# Patient Record
Sex: Male | Born: 1995 | Race: Black or African American | Hispanic: No | Marital: Single | Smoking: Never smoker
Health system: Southern US, Community
[De-identification: ages and names within clinical notes are randomized; demographics above are authoritative.]

---
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
## 2020-12-20 ENCOUNTER — Emergency Department (HOSPITAL_BASED_OUTPATIENT_CLINIC_OR_DEPARTMENT_OTHER)
Admission: EM | Admit: 2020-12-20 | Discharge: 2020-12-20 | Disposition: A | Discharge status: Home / Self Care | Payer: Self-pay | Attending: Emergency Medicine | Admitting: Emergency Medicine

## 2020-12-20 ENCOUNTER — Other Ambulatory Visit: Payer: Self-pay

## 2020-12-20 ENCOUNTER — Encounter (HOSPITAL_BASED_OUTPATIENT_CLINIC_OR_DEPARTMENT_OTHER): Payer: Self-pay

## 2020-12-20 DIAGNOSIS — R062 Wheezing: Secondary | ICD-10-CM | POA: Insufficient documentation

## 2020-12-20 DIAGNOSIS — R0602 Shortness of breath: Secondary | ICD-10-CM | POA: Insufficient documentation

## 2020-12-20 DIAGNOSIS — R0789 Other chest pain: Secondary | ICD-10-CM | POA: Insufficient documentation

## 2020-12-20 DIAGNOSIS — Z5321 Procedure and treatment not carried out due to patient leaving prior to being seen by health care provider: Secondary | ICD-10-CM | POA: Insufficient documentation

## 2020-12-20 NOTE — ED Notes (Signed)
Pt left from waiting room, informed registration that he was leaving. Pt was alert, no s/s of distress.Pt signed MSE.

## 2020-12-20 NOTE — ED Triage Notes (Signed)
CP and some wheezing/SOB after eating a new dietary supplement.  SOB has subsided on arrival but he is currently c/o chest pressure.  Denies allergies to foods/dyes/latex.

## 2020-12-20 NOTE — ED Triage Notes (Signed)
Emergency Medicine Provider Triage Evaluation Note  Cody Rodgers , a 25 y.o. male  was evaluated in triage.  Pt complains of wheezing, chest pain at 12:40 after taking superfood - "Ideal greens" . Second time taking it. Took the powder in a spoon and put it in his mouth and then started coughing and had some wheezing, mild CP, mild SOB Mostly resolved, mild wheezing, no CP anymore.  No hx of asthma.    Review of Systems  Positive: Wheezing, cp after eating Ideal greens around lunch.  Negative: Lip swelling, rash, pruritis   Physical Exam  BP (!) 147/87 (BP Location: Left Arm)   Pulse 73   Temp 98.7 F (37.1 C) (Oral)   Resp 18   Ht 6\' 5"  (1.956 m)   Wt 128.8 kg   SpO2 100%   BMI 33.68 kg/m  Gen:   Awake, no distress   HEENT:  Atraumatic, no angioedema  Resp:  Normal effort. Mild expiratory wheezes heardon exam.  Cardiac:  Normal rate Abd:   Nondistended, nontender  MSK:   Moves extremities without difficulty  Skin:  No Rash  Neuro:  Speech clear, moving all extremities, normal gait   Medical Decision Making  Medically screening exam initiated at 3:58 PM.  Appropriate orders placed.  Cody Rodgers was informed that the remainder of the evaluation will be completed by another provider, this initial triage assessment does not replace that evaluation, and the importance of remaining in the ED until their evaluation is complete.  Clinical Impression  Cough/wheezing after superfoods. Possible aspiration vs allergic reaction. Stable, no respiratory distress. Does not appear cardiac.    Herbert Spires, PA-C 12/20/20 [phone]

## 2021-01-31 ENCOUNTER — Encounter (HOSPITAL_BASED_OUTPATIENT_CLINIC_OR_DEPARTMENT_OTHER): Payer: Self-pay | Admitting: *Deleted

## 2021-01-31 ENCOUNTER — Emergency Department (HOSPITAL_BASED_OUTPATIENT_CLINIC_OR_DEPARTMENT_OTHER)
Admission: EM | Admit: 2021-01-31 | Discharge: 2021-01-31 | Disposition: A | Discharge status: Home / Self Care | Payer: 59 | Attending: Emergency Medicine | Admitting: Emergency Medicine

## 2021-01-31 ENCOUNTER — Other Ambulatory Visit: Payer: Self-pay

## 2021-01-31 ENCOUNTER — Emergency Department (HOSPITAL_BASED_OUTPATIENT_CLINIC_OR_DEPARTMENT_OTHER): Payer: 59

## 2021-01-31 DIAGNOSIS — X500XXA Overexertion from strenuous movement or load, initial encounter: Secondary | ICD-10-CM | POA: Insufficient documentation

## 2021-01-31 DIAGNOSIS — S76811A Strain of other specified muscles, fascia and tendons at thigh level, right thigh, initial encounter: Secondary | ICD-10-CM | POA: Insufficient documentation

## 2021-01-31 DIAGNOSIS — S79921A Unspecified injury of right thigh, initial encounter: Secondary | ICD-10-CM | POA: Diagnosis present

## 2021-01-31 DIAGNOSIS — S76211A Strain of adductor muscle, fascia and tendon of right thigh, initial encounter: Secondary | ICD-10-CM

## 2021-01-31 MED ORDER — NAPROXEN 500 MG PO TABS: 500.0000 mg | ORAL_TABLET | Freq: Two times a day (BID) | ORAL | 0 refills | Status: DC

## 2021-01-31 NOTE — ED Triage Notes (Signed)
Pain in his right groin while lifting weights today. Denies testicle pain.

## 2021-01-31 NOTE — ED Provider Notes (Signed)
[facility] EMERGENCY DEPARTMENT Provider Note   CSN: [id number] Arrival date & time: 01/31/21  1320     History Chief Complaint  Patient presents with  . Groin Injury    Cody Rodgers is a 25 y.o. male.  25 year old male presents the ER with complaint of right groin pain.  Patient states that he was working out this morning and when he was squatting he felt something pop in his right groin area.  Pain is worse with movement of his right leg.  Denies testicular pain or swelling.  No recent antibiotic use, no fevers.  No other complaints or concerns.        History reviewed. No pertinent past medical history.  There are no problems to display for this patient.   History reviewed. No pertinent surgical history.     No family history on file.  Social History   Tobacco Use  . Smoking status: Never Smoker  . Smokeless tobacco: Never Used  Substance Use Topics  . Alcohol use: Never  . Drug use: Never    Home Medications Prior to Admission medications   Medication Sig Start Date End Date Taking? Authorizing Provider  naproxen (NAPROSYN) 500 MG tablet Take 1 tablet (500 mg total) by mouth 2 (two) times daily. 01/31/21  Yes Jeannie Fend, PA-C    Allergies    Patient has no known allergies.  Review of Systems   Review of Systems  Constitutional: Negative for fever.  Gastrointestinal: Negative for abdominal pain, nausea and vomiting.  Genitourinary: Negative for scrotal swelling and testicular pain.  Musculoskeletal: Positive for arthralgias and gait problem.  Skin: Negative for rash and wound.  Allergic/Immunologic: Negative for immunocompromised state.  Neurological: Negative for weakness and numbness.  All other systems reviewed and are negative.   Physical Exam Updated Vital Signs BP (!) 142/84 (BP Location: Right Arm)   Pulse 78   Temp 98.4 F (36.9 C) (Oral)   Resp 18   Ht 6\' 5"  (1.956 m)   Wt (!) 165.6 kg   SpO2 95%   BMI 43.29 kg/m    Physical Exam Vitals and nursing note reviewed.  Constitutional:      General: He is not in acute distress.    Appearance: He is well-developed. He is not diaphoretic.  HENT:     Head: Normocephalic and atraumatic.  Cardiovascular:     Pulses: Normal pulses.  Pulmonary:     Effort: Pulmonary effort is normal.  Abdominal:     Palpations: Abdomen is soft.     Tenderness: There is no abdominal tenderness.  Musculoskeletal:        General: Tenderness present. No swelling or deformity.       Legs:     Comments: Limited active ROM right hip due to pain in the right groin. Able to passively range hip but does have pain with full abduction.   Skin:    General: Skin is warm and dry.     Findings: No erythema or rash.  Neurological:     Mental Status: He is alert and oriented to person, place, and time.  Psychiatric:        Behavior: Behavior normal.     ED Results / Procedures / Treatments   Labs (all labs ordered are listed, but only abnormal results are displayed) Labs Reviewed - No data to display  EKG None  Radiology DG Hip Unilat With Pelvis 2-3 Views Right  Result Date: 01/31/2021 CLINICAL DATA:  Right groin  pain while squatting. EXAM: DG HIP (WITH OR WITHOUT PELVIS) 2-3V RIGHT COMPARISON:  None. FINDINGS: There is no evidence of an acute hip fracture or dislocation. There is no evidence of arthropathy or other focal bone abnormality. Small phleboliths are noted within the pelvis. IMPRESSION: No acute osseous abnormality. Electronically Signed   By: Aram Candela M.D.   On: 01/31/2021 15:14    Procedures Procedures   Medications Ordered in ED Medications - No data to display  ED Course  I have reviewed the triage vital signs and the nursing notes.  Pertinent labs & imaging results that were available during my care of the patient were reviewed by me and considered in my medical decision making (see chart for details).  Clinical Course as of 01/31/21 1517   Tue Jan 31, 2021  [id number] 25 year old male with right groin pain after lifting weights today, no testicular pain.  Found a tenderness to his right groin area, limited active range of motion, passive range of motion found to have pain with abduction.  X-ray of the right hip is unremarkable.  Patient will be given crutches to weight-bear as tolerated, given prescription for naproxen and referred to sports medicine for follow-up. [LM]    Clinical Course User Index [LM] Alden Hipp   MDM Rules/Calculators/A&P                          Final Clinical Impression(s) / ED Diagnoses Final diagnoses:  Inguinal strain, right, initial encounter    Rx / DC Orders ED Discharge Orders         Ordered    naproxen (NAPROSYN) 500 MG tablet  2 times daily        01/31/21 1513           Jeannie Fend, PA-C 01/31/21 1517    Terald Sleeper, MD 01/31/21 1815

## 2021-01-31 NOTE — ED Notes (Signed)
Pt reports was doing squats this morning, "felt pop" in right groin radiating to right thigh and right knee, worse with movement.

## 2021-01-31 NOTE — Discharge Instructions (Signed)
Follow up with sports medicine, call to schedule an appointment. Take Naproxen as prescribed. Use crutches to weight bear as tolerated.

## 2021-02-01 ENCOUNTER — Encounter: Payer: Self-pay | Admitting: Family Medicine

## 2021-02-01 ENCOUNTER — Ambulatory Visit (INDEPENDENT_AMBULATORY_CARE_PROVIDER_SITE_OTHER): Payer: 59 | Admitting: Family Medicine

## 2021-02-01 ENCOUNTER — Ambulatory Visit: Payer: Self-pay

## 2021-02-01 ENCOUNTER — Other Ambulatory Visit: Payer: Self-pay

## 2021-02-01 VITALS — BP 138/82 | Ht 77.0 in | Wt 365.0 lb

## 2021-02-01 DIAGNOSIS — S76311A Strain of muscle, fascia and tendon of the posterior muscle group at thigh level, right thigh, initial encounter: Secondary | ICD-10-CM

## 2021-02-01 DIAGNOSIS — M79604 Pain in right leg: Secondary | ICD-10-CM

## 2021-02-01 NOTE — Assessment & Plan Note (Signed)
Injury occurred on 5/17 while he was performing a squat.  Has changes at the origin of the hamstring but does not suggest a tear at this time. -Counseled on home exercise therapy and supportive care. -Counseled on naproxen. -Counseled on compression -Could consider physical therapy

## 2021-02-01 NOTE — Patient Instructions (Signed)
Nice to meet you Please use the naproxen for 5 days straight and then as needed  Please try compression  Please try heat  Please try the exercises   Please send me a message in MyChart with any questions or updates.  Please see me back in 3 weeks.   --Dr. Jordan Likes

## 2021-02-01 NOTE — Progress Notes (Signed)
  Cody Rodgers - 25 y.o. male MRN [id number]  Date of birth: 1995/11/09  SUBJECTIVE:  Including CC & ROS.  No chief complaint on file.   Cody Rodgers is a 25 y.o. male that is presenting with right leg pain.  He was squatting yesterday and felt significant pain while he was performing a 1 rep max on his lift.  Had significant pain was seen in emergency department.  Has felt improved with taking the naproxen and using a crutch.  Independent review of the AP pelvis and right hip x-ray from 5/17 shows no acute changes.   Review of Systems See HPI   HISTORY: Past Medical, Surgical, Social, and Family History Reviewed & Updated per EMR.   Pertinent Historical Findings include:  History reviewed. No pertinent past medical history.  History reviewed. No pertinent surgical history.  History reviewed. No pertinent family history.  Social History   Socioeconomic History  . Marital status: Single    Spouse name: Not on file  . Number of children: Not on file  . Years of education: Not on file  . Highest education level: Not on file  Occupational History  . Not on file  Tobacco Use  . Smoking status: Never Smoker  . Smokeless tobacco: Never Used  Substance and Sexual Activity  . Alcohol use: Never  . Drug use: Never  . Sexual activity: Not on file  Other Topics Concern  . Not on file  Social History Narrative  . Not on file   Social Determinants of Health   Financial Resource Strain: Not on file  Food Insecurity: Not on file  Transportation Needs: Not on file  Physical Activity: Not on file  Stress: Not on file  Social Connections: Not on file  Intimate Partner Violence: Not on file     PHYSICAL EXAM:  VS: BP 138/82 (BP Location: Left Arm, Patient Position: Sitting, Cuff Size: Large)   Ht 6\' 5"  (1.956 m)   Wt (!) 365 lb (165.6 kg)   BMI 43.28 kg/m  Physical Exam Gen: NAD, alert, cooperative with exam, well-appearing MSK:  Right thigh: No swelling or  ecchymosis. Limited external rotation. Normal strength with hip flexion. Neurovascular intact  Limited ultrasound: Right thigh:  No changes appreciated of the vastus medialis. Normal-appearing adductors. No disruption of the origin of the hamstring but does have hyperemia at the ischial tuberosity.  This appears to be semitendinosus   Summary: Findings consistent with semitendinosus strain  Ultrasound and interpretation by , MD    ASSESSMENT & PLAN:   Hamstring strain, right, initial encounter Injury occurred on 5/17 while he was performing a squat.  Has changes at the origin of the hamstring but does not suggest a tear at this time. -Counseled on home exercise therapy and supportive care. -Counseled on naproxen. -Counseled on compression -Could consider physical therapy

## 2021-02-21 ENCOUNTER — Ambulatory Visit: Payer: 59 | Admitting: Family Medicine

## 2021-05-17 ENCOUNTER — Other Ambulatory Visit: Payer: Self-pay

## 2021-05-17 ENCOUNTER — Ambulatory Visit (INDEPENDENT_AMBULATORY_CARE_PROVIDER_SITE_OTHER): Payer: 59 | Admitting: Emergency Medicine

## 2021-05-17 ENCOUNTER — Encounter: Payer: Self-pay | Admitting: Emergency Medicine

## 2021-05-17 VITALS — BP 132/78 | HR 70 | Temp 98.8°F | Ht 77.0 in | Wt 368.0 lb

## 2021-05-17 DIAGNOSIS — Z1159 Encounter for screening for other viral diseases: Secondary | ICD-10-CM

## 2021-05-17 DIAGNOSIS — Z87898 Personal history of other specified conditions: Secondary | ICD-10-CM

## 2021-05-17 DIAGNOSIS — Z23 Encounter for immunization: Secondary | ICD-10-CM | POA: Diagnosis not present

## 2021-05-17 DIAGNOSIS — Z114 Encounter for screening for human immunodeficiency virus [HIV]: Secondary | ICD-10-CM

## 2021-05-17 DIAGNOSIS — Z13228 Encounter for screening for other metabolic disorders: Secondary | ICD-10-CM

## 2021-05-17 DIAGNOSIS — Z0001 Encounter for general adult medical examination with abnormal findings: Secondary | ICD-10-CM

## 2021-05-17 DIAGNOSIS — Z130 Encounter for screening for diseases of the blood and blood-forming organs and certain disorders involving the immune mechanism: Secondary | ICD-10-CM | POA: Diagnosis not present

## 2021-05-17 DIAGNOSIS — Z1329 Encounter for screening for other suspected endocrine disorder: Secondary | ICD-10-CM

## 2021-05-17 DIAGNOSIS — Z13220 Encounter for screening for lipoid disorders: Secondary | ICD-10-CM

## 2021-05-17 LAB — COMPREHENSIVE METABOLIC PANEL
ALT: 32 U/L (ref 0–53)
AST: 29 U/L (ref 0–37)
Albumin: 4.4 g/dL (ref 3.5–5.2)
Alkaline Phosphatase: 85 U/L (ref 39–117)
BUN: 10 mg/dL (ref 6–23)
CO2: 30 mEq/L (ref 19–32)
Calcium: 10.1 mg/dL (ref 8.4–10.5)
Chloride: 102 mEq/L (ref 96–112)
Creatinine, Ser: 1.11 mg/dL (ref 0.40–1.50)
GFR: 92.85 mL/min (ref >60.00)
Glucose, Bld: 93 mg/dL (ref 70–99)
Potassium: 4.2 mEq/L (ref 3.5–5.1)
Sodium: 139 mEq/L (ref 135–145)
Total Bilirubin: 0.6 mg/dL (ref 0.2–1.2)
Total Protein: 8 g/dL (ref 6.0–8.3)

## 2021-05-17 LAB — LIPID PANEL
Cholesterol: 200 mg/dL (ref 0–200)
HDL: 39.4 mg/dL (ref >39.00)
LDL Cholesterol: 140 mg/dL — ABNORMAL HIGH (ref 0–99)
NonHDL: 160.13
Total CHOL/HDL Ratio: 5
Triglycerides: 102 mg/dL (ref 0.0–149.0)
VLDL: 20.4 mg/dL (ref 0.0–40.0)

## 2021-05-17 LAB — CBC WITH DIFFERENTIAL/PLATELET
Basophils Absolute: 0 10*3/uL (ref 0.0–0.1)
Basophils Relative: 0.6 % (ref 0.0–3.0)
Eosinophils Absolute: 0.1 10*3/uL (ref 0.0–0.7)
Eosinophils Relative: 2.1 % (ref 0.0–5.0)
HCT: 44.7 % (ref 39.0–52.0)
Hemoglobin: 14.8 g/dL (ref 13.0–17.0)
Lymphocytes Relative: 51.5 % — ABNORMAL HIGH (ref 12.0–46.0)
Lymphs Abs: 2.2 10*3/uL (ref 0.7–4.0)
MCHC: 33.2 g/dL (ref 30.0–36.0)
MCV: 89.3 fl (ref 78.0–100.0)
Monocytes Absolute: 0.4 10*3/uL (ref 0.1–1.0)
Monocytes Relative: 9.6 % (ref 3.0–12.0)
Neutro Abs: 1.5 10*3/uL (ref 1.4–7.7)
Neutrophils Relative %: 36.2 % — ABNORMAL LOW (ref 43.0–77.0)
Platelets: 190 10*3/uL (ref 150.0–400.0)
RBC: 5.01 Mil/uL (ref 4.22–5.81)
RDW: 12.5 % (ref 11.5–15.5)
WBC: 4.2 10*3/uL (ref 4.0–10.5)

## 2021-05-17 LAB — HEMOGLOBIN A1C: Hgb A1c MFr Bld: 4.9 % (ref 4.6–6.5)

## 2021-05-17 NOTE — Patient Instructions (Signed)
Health Maintenance, Male Adopting a healthy lifestyle and getting preventive care are important in promoting health and wellness. Ask your health care provider about: The right schedule for you to have regular tests and exams. Things you can do on your own to prevent diseases and keep yourself healthy. What should I know about diet, weight, and exercise? Eat a healthy diet  Eat a diet that includes plenty of vegetables, fruits, low-fat dairy products, and lean protein. Do not eat a lot of foods that are high in solid fats, added sugars, or sodium. Maintain a healthy weight Body mass index (BMI) is a measurement that can be used to identify possible weight problems. It estimates body fat based on height and weight. Your health care provider can help determine your BMI and help you achieve or maintain a healthy weight. Get regular exercise Get regular exercise. This is one of the most important things you can do for your health. Most adults should: Exercise for at least 150 minutes each week. The exercise should increase your heart rate and make you sweat (moderate-intensity exercise). Do strengthening exercises at least twice a week. This is in addition to the moderate-intensity exercise. Spend less time sitting. Even light physical activity can be beneficial. Watch cholesterol and blood lipids Have your blood tested for lipids and cholesterol at 24 years of age, then have this test every 5 years. You may need to have your cholesterol levels checked more often if: Your lipid or cholesterol levels are high. You are older than 24 years of age. You are at high risk for heart disease. What should I know about cancer screening? Many types of cancers can be detected early and may often be prevented. Depending on your health history and family history, you may need to have cancer screening at various ages. This may include screening for: Colorectal cancer. Prostate cancer. Skin cancer. Lung  cancer. What should I know about heart disease, diabetes, and high blood pressure? Blood pressure and heart disease High blood pressure causes heart disease and increases the risk of stroke. This is more likely to develop in people who have high blood pressure readings, are of African descent, or are overweight. Talk with your health care provider about your target blood pressure readings. Have your blood pressure checked: Every 3-5 years if you are 18-39 years of age. Every year if you are 40 years old or older. If you are between the ages of 65 and 75 and are a current or former smoker, ask your health care provider if you should have a one-time screening for abdominal aortic aneurysm (AAA). Diabetes Have regular diabetes screenings. This checks your fasting blood sugar level. Have the screening done: Once every three years after age 45 if you are at a normal weight and have a low risk for diabetes. More often and at a younger age if you are overweight or have a high risk for diabetes. What should I know about preventing infection? Hepatitis B If you have a higher risk for hepatitis B, you should be screened for this virus. Talk with your health care provider to find out if you are at risk for hepatitis B infection. Hepatitis C Blood testing is recommended for: Everyone born from 1945 through 1965. Anyone with known risk factors for hepatitis C. Sexually transmitted infections (STIs) You should be screened each year for STIs, including gonorrhea and chlamydia, if: You are sexually active and are younger than 24 years of age. You are older than 24 years   of age and your health care provider tells you that you are at risk for this type of infection. Your sexual activity has changed since you were last screened, and you are at increased risk for chlamydia or gonorrhea. Ask your health care provider if you are at risk. Ask your health care provider about whether you are at high risk for HIV.  Your health care provider may recommend a prescription medicine to help prevent HIV infection. If you choose to take medicine to prevent HIV, you should first get tested for HIV. You should then be tested every 3 months for as long as you are taking the medicine. Follow these instructions at home: Lifestyle Do not use any products that contain nicotine or tobacco, such as cigarettes, e-cigarettes, and chewing tobacco. If you need help quitting, ask your health care provider. Do not use street drugs. Do not share needles. Ask your health care provider for help if you need support or information about quitting drugs. Alcohol use Do not drink alcohol if your health care provider tells you not to drink. If you drink alcohol: Limit how much you have to 0-2 drinks a day. Be aware of how much alcohol is in your drink. In the [place] one drink equals one 12 oz bottle of beer (355 mL), one 5 oz glass of wine (148 mL), or one 1 oz glass of hard liquor (44 mL). General instructions Schedule regular health, dental, and eye exams. Stay current with your vaccines. Tell your health care provider if: You often feel depressed. You have ever been abused or do not feel safe at home. Summary Adopting a healthy lifestyle and getting preventive care are important in promoting health and wellness. Follow your health care provider's instructions about healthy diet, exercising, and getting tested or screened for diseases. Follow your health care provider's instructions on monitoring your cholesterol and blood pressure. This information is not intended to replace advice given to you by your health care provider. Make sure you discuss any questions you have with your health care provider. Document Revised: 11/11/2020 Document Reviewed: 08/27/2018 [employer]vier Patient Education  2022 [employer]  

## 2021-05-17 NOTE — Progress Notes (Signed)
Cody Rodgers 25 y.o.   Chief Complaint  Patient presents with   New Patient (Initial Visit)    Physical, discuss diabetes    HISTORY OF PRESENT ILLNESS: This is a 25 y.o. male first visit to this office here to establish care.  Needs annual exam. Has the following concerns: #1 history of prediabetes #2 needs advised on weight management #3 Daily intermittent headaches. No other complaints or medical concerns today.  HPI   Prior to Admission medications   Not on File    No Known Allergies  Patient Active Problem List   Diagnosis Date Noted   Hamstring strain, right, initial encounter 02/01/2021    No past medical history on file.  No past surgical history on file.  Social History   Socioeconomic History   Marital status: Single    Spouse name: Not on file   Number of children: Not on file   Years of education: Not on file   Highest education level: Not on file  Occupational History   Not on file  Tobacco Use   Smoking status: Never   Smokeless tobacco: Never  Substance and Sexual Activity   Alcohol use: Never   Drug use: Never   Sexual activity: Not on file  Other Topics Concern   Not on file  Social History Narrative   Not on file   Social Determinants of Health   Financial Resource Strain: Not on file  Food Insecurity: Not on file  Transportation Needs: Not on file  Physical Activity: Not on file  Stress: Not on file  Social Connections: Not on file  Intimate Partner Violence: Not on file    No family history on file.   Review of Systems  Constitutional: Negative.  Negative for chills and fever.  HENT: Negative.  Negative for congestion and sore throat.   Respiratory: Negative.  Negative for cough and shortness of breath.   Cardiovascular: Negative.  Negative for chest pain and palpitations.  Gastrointestinal:  Negative for abdominal pain, diarrhea, nausea and vomiting.  Genitourinary: Negative.  Negative for dysuria and hematuria.   Skin: Negative.  Negative for rash.  Neurological:  Positive for headaches. Negative for dizziness.  All other systems reviewed and are negative.   Physical Exam Vitals reviewed.  Constitutional:      Appearance: He is obese.  HENT:     Head: Normocephalic.     Right Ear: Tympanic membrane, ear canal and external ear normal.     Left Ear: Tympanic membrane, ear canal and external ear normal.     Mouth/Throat:     Mouth: Mucous membranes are moist.     Pharynx: Oropharynx is clear.  Eyes:     Extraocular Movements: Extraocular movements intact.     Conjunctiva/sclera: Conjunctivae normal.     Pupils: Pupils are equal, round, and reactive to light.  Cardiovascular:     Rate and Rhythm: Normal rate and regular rhythm.     Pulses: Normal pulses.     Heart sounds: Normal heart sounds.  Pulmonary:     Effort: Pulmonary effort is normal.     Breath sounds: Normal breath sounds.  Musculoskeletal:        General: Normal range of motion.     Cervical back: Normal range of motion and neck supple.  Skin:    General: Skin is warm and dry.     Capillary Refill: Capillary refill takes less than 2 seconds.  Neurological:     General: No focal deficit  present.     Mental Status: He is alert and oriented to person, place, and time.  Psychiatric:        Mood and Affect: Mood normal.        Behavior: Behavior normal.     ASSESSMENT & PLAN: Cody Rodgers was seen today for new patient (initial visit).  Diagnoses and all orders for this visit:  Encounter for general adult medical examination with abnormal findings  Need for influenza vaccination -     Flu Vaccine QUAD 84mo+IM (Fluarix, Fluzone & Alfiuria Quad PF)  Morbid obesity (HCC) -     Comprehensive metabolic panel -     Hemoglobin A1c -     Lipid panel -     Amb Ref to Medical Weight Management  Need for hepatitis C screening test -     Hepatitis C antibody screen  Screening for HIV (human immunodeficiency virus) -     HIV  antibody  History of prediabetes -     Hemoglobin A1c  Screening for deficiency anemia -     CBC with Differential  Screening for lipoid disorders  Screening for endocrine, metabolic and immunity disorder  Modifiable risk factors discussed with patient. Anticipatory guidance according to age provided. The following topics were also discussed: Social Determinants of Health Smoking Diet and nutrition and need to decrease amount of daily carbohydrate intake Referral to medical weight management clinic placed today Benefits of exercise Cancer family history review Vaccinations recommendations Cardiovascular risk assessment Mental health including depression and anxiety Fall and accident prevention  Patient Instructions  Health Maintenance, Male Adopting a healthy lifestyle and getting preventive care are important in promoting health and wellness. Ask your health care provider about: The right schedule for you to have regular tests and exams. Things you can do on your own to prevent diseases and keep yourself healthy. What should I know about diet, weight, and exercise? Eat a healthy diet  Eat a diet that includes plenty of vegetables, fruits, low-fat dairy products, and lean protein. Do not eat a lot of foods that are high in solid fats, added sugars, or sodium. Maintain a healthy weight Body mass index (BMI) is a measurement that can be used to identify possible weight problems. It estimates body fat based on height and weight. Your health care provider can help determine your BMI and help you achieve or maintain a healthy weight. Get regular exercise Get regular exercise. This is one of the most important things you can do for your health. Most adults should: Exercise for at least 150 minutes each week. The exercise should increase your heart rate and make you sweat (moderate-intensity exercise). Do strengthening exercises at least twice a week. This is in addition to the  moderate-intensity exercise. Spend less time sitting. Even light physical activity can be beneficial. Watch cholesterol and blood lipids Have your blood tested for lipids and cholesterol at 24 years of age, then have this test every 5 years. You may need to have your cholesterol levels checked more often if: Your lipid or cholesterol levels are high. You are older than 24 years of age. You are at high risk for heart disease. What should I know about cancer screening? Many types of cancers can be detected early and may often be prevented. Depending on your health history and family history, you may need to have cancer screening at various ages. This may include screening for: Colorectal cancer. Prostate cancer. Skin cancer. Lung cancer. What should I know about  heart disease, diabetes, and high blood pressure? Blood pressure and heart disease High blood pressure causes heart disease and increases the risk of stroke. This is more likely to develop in people who have high blood pressure readings, are of African descent, or are overweight. Talk with your health care provider about your target blood pressure readings. Have your blood pressure checked: Every 3-5 years if you are 7418-24 years of age. Every year if you are 24 years old or older. If you are between the ages of 4865 and 2875 and are a current or former smoker, ask your health care provider if you should have a one-time screening for abdominal aortic aneurysm (AAA). Diabetes Have regular diabetes screenings. This checks your fasting blood sugar level. Have the screening done: Once every three years after age 24 if you are at a normal weight and have a low risk for diabetes. More often and at a younger age if you are overweight or have a high risk for diabetes. What should I know about preventing infection? Hepatitis B If you have a higher risk for hepatitis B, you should be screened for this virus. Talk with your health care provider to  find out if you are at risk for hepatitis B infection. Hepatitis C Blood testing is recommended for: Everyone born from [birth year] through 1964. Anyone with known risk factors for hepatitis C. Sexually transmitted infections (STIs) You should be screened each year for STIs, including gonorrhea and chlamydia, if: You are sexually active and are younger than 24 years of age. You are older than 24 years of age and your health care provider tells you that you are at risk for this type of infection. Your sexual activity has changed since you were last screened, and you are at increased risk for chlamydia or gonorrhea. Ask your health care provider if you are at risk. Ask your health care provider about whether you are at high risk for HIV. Your health care provider may recommend a prescription medicine to help prevent HIV infection. If you choose to take medicine to prevent HIV, you should first get tested for HIV. You should then be tested every 3 months for as long as you are taking the medicine. Follow these instructions at home: Lifestyle Do not use any products that contain nicotine or tobacco, such as cigarettes, e-cigarettes, and chewing tobacco. If you need help quitting, ask your health care provider. Do not use street drugs. Do not share needles. Ask your health care provider for help if you need support or information about quitting drugs. Alcohol use Do not drink alcohol if your health care provider tells you not to drink. If you drink alcohol: Limit how much you have to 0-2 drinks a day. Be aware of how much alcohol is in your drink. In the [place] one drink equals one 12 oz bottle of beer (355 mL), one 5 oz glass of wine (148 mL), or one 1 oz glass of hard liquor (44 mL). General instructions Schedule regular health, dental, and eye exams. Stay current with your vaccines. Tell your health care provider if: You often feel depressed. You have ever been abused or do not feel safe at  home. Summary Adopting a healthy lifestyle and getting preventive care are important in promoting health and wellness. Follow your health care provider's instructions about healthy diet, exercising, and getting tested or screened for diseases. Follow your health care provider's instructions on monitoring your cholesterol and blood pressure. This information is not intended to  replace advice given to you by your health care provider. Make sure you discuss any questions you have with your health care provider. Document Revised: 11/11/2020 Document Reviewed: 08/27/2018 Elsevier Patient Education  2022 Elsevier Inc.   Edwina Barth, MD [facility]

## 2021-05-18 LAB — HIV ANTIBODY (ROUTINE TESTING W REFLEX): HIV 1&2 Ab, 4th Generation: NONREACTIVE

## 2021-05-18 LAB — HEPATITIS C ANTIBODY
Hepatitis C Ab: NONREACTIVE
SIGNAL TO CUT-OFF: 0.05 (ref <1.00)

## 2021-06-19 ENCOUNTER — Telehealth (INDEPENDENT_AMBULATORY_CARE_PROVIDER_SITE_OTHER): Payer: 59 | Admitting: Emergency Medicine

## 2021-06-19 ENCOUNTER — Encounter: Payer: Self-pay | Admitting: Emergency Medicine

## 2021-06-19 DIAGNOSIS — B349 Viral infection, unspecified: Secondary | ICD-10-CM

## 2021-06-19 NOTE — Progress Notes (Signed)
Telemedicine Encounter- SOAP NOTE Established Patient MyChart video conference attempted but unsuccessful Patient: Home  Provider: Office   Patient present only  This telephone encounter was conducted with the patient's (or proxy's) verbal consent via audio telecommunications: yes/no: Yes Patient was instructed to have this encounter in a suitably private space; and to only have persons present to whom they give permission to participate. In addition, patient identity was confirmed by use of name plus two identifiers (DOB and address).  I discussed the limitations, risks, security and privacy concerns of performing an evaluation and management service by telephone and the availability of in person appointments. I also discussed with the patient that there may be a patient responsible charge related to this service. The patient expressed understanding and agreed to proceed.  I spent a total of TIME; 0 MIN TO 60 MIN: 15 minutes talking with the patient or their proxy.  No chief complaint on file.   Subjective   Cody Rodgers is a 25 y.o. established patient. Telephone visit today for  HPI   Patient Active Problem List   Diagnosis Date Noted   History of prediabetes 05/17/2021   Morbid obesity (HCC) 05/17/2021   Hamstring strain, right, initial encounter 02/01/2021    No past medical history on file.  No current outpatient medications on file.   No current facility-administered medications for this visit.    No Known Allergies  Social History   Socioeconomic History   Marital status: Single    Spouse name: Not on file   Number of children: Not on file   Years of education: Not on file   Highest education level: Not on file  Occupational History   Not on file  Tobacco Use   Smoking status: Never   Smokeless tobacco: Never  Substance and Sexual Activity   Alcohol use: Never   Drug use: Never   Sexual activity: Not on file  Other Topics Concern   Not on file   Social History Narrative   Not on file   Social Determinants of Health   Financial Resource Strain: Not on file  Food Insecurity: Not on file  Transportation Needs: Not on file  Physical Activity: Not on file  Stress: Not on file  Social Connections: Not on file  Intimate Partner Violence: Not on file    Review of Systems  Constitutional: Negative.  Negative for chills and fever.  HENT:  Positive for sore throat. Negative for congestion.   Respiratory:  Negative for cough and shortness of breath.   Cardiovascular:  Negative for chest pain and palpitations.  Gastrointestinal:  Negative for abdominal pain, diarrhea, nausea and vomiting.  Genitourinary: Negative.   Skin:  Positive for rash.  Neurological:  Positive for headaches. Negative for dizziness.  All other systems reviewed and are negative.  Objective  Alert and oriented x3 in no apparent respiratory distress Vitals as reported by the patient: There were no vitals filed for this visit.  Diagnoses and all orders for this visit:  Viral illness    Clinically stable.  No red flag signs or symptoms. Viral illness needs to run its course. Advised to contact the office if no better or worse during the next several days. I discussed the assessment and treatment plan with the patient. The patient was provided an opportunity to ask questions and all were answered. The patient agreed with the plan and demonstrated an understanding of the instructions.   The patient was advised to call back  or seek an in-person evaluation if the symptoms worsen or if the condition fails to improve as anticipated.  I provided 15 minutes of non-face-to-face time during this encounter.  Georgina Quint, MD  Primary Care at [facility]

## 2021-07-14 ENCOUNTER — Ambulatory Visit
Admission: EM | Admit: 2021-07-14 | Discharge: 2021-07-14 | Disposition: A | Discharge status: Home / Self Care | Payer: 59 | Attending: Physician Assistant | Admitting: Physician Assistant

## 2021-07-14 ENCOUNTER — Other Ambulatory Visit: Payer: Self-pay

## 2021-07-14 ENCOUNTER — Encounter: Payer: Self-pay | Admitting: Emergency Medicine

## 2021-07-14 DIAGNOSIS — J069 Acute upper respiratory infection, unspecified: Secondary | ICD-10-CM | POA: Diagnosis present

## 2021-07-14 NOTE — ED Provider Notes (Signed)
[facility]    CSN: [id number] Arrival date & time: 07/14/21  1922      History   Chief Complaint Chief Complaint  Patient presents with   Sore Throat    HPI Cody Rodgers is a 25 y.o. male.   Patient here today for evaluation of sore throat has had for the last 2 weeks and cough that just started today.  He does not report fever.  He has not had vomiting or diarrhea.  He has tried over-the-counter treatment he does not report any treatment for symptoms.  The history is provided by the patient.  Sore Throat Pertinent negatives include no abdominal pain and no shortness of breath.   History reviewed. No pertinent past medical history.  Patient Active Problem List   Diagnosis Date Noted   History of prediabetes 05/17/2021   Morbid obesity (HCC) 05/17/2021   Hamstring strain, right, initial encounter 02/01/2021    History reviewed. No pertinent surgical history.     Home Medications    Prior to Admission medications   Not on File    Family History No family history on file.  Social History Social History   Tobacco Use   Smoking status: Never   Smokeless tobacco: Never  Substance Use Topics   Alcohol use: Never   Drug use: Never     Allergies   Patient has no known allergies.   Review of Systems Review of Systems  Constitutional:  Negative for chills and fever.  HENT:  Positive for congestion and sore throat. Negative for ear pain.   Eyes:  Negative for discharge and redness.  Respiratory:  Positive for cough. Negative for shortness of breath.   Gastrointestinal:  Negative for abdominal pain, nausea and vomiting.    Physical Exam Triage Vital Signs ED Triage Vitals  Enc Vitals Group     BP 07/14/21 1934 (!) 130/94     Pulse Rate 07/14/21 1934 79     Resp --      Temp 07/14/21 1934 98.2 F (36.8 C)     Temp Source 07/14/21 1934 Oral     SpO2 07/14/21 1934 95 %     Weight 07/14/21 1937 (!) 370 lb (167.8 kg)     Height  07/14/21 1937 6\' 4"  (1.93 m)     Head Circumference --      Peak Flow --      Pain Score 07/14/21 1936 7     Pain Loc --      Pain Edu? --      Excl. in GC? --    No data found.  Updated Vital Signs BP (!) 130/94 (BP Location: Left Arm)   Pulse 79   Temp 98.2 F (36.8 C) (Oral)   Ht 6\' 4"  (1.93 m)   Wt (!) 370 lb (167.8 kg)   SpO2 95%   BMI 45.04 kg/m   Physical Exam Vitals and nursing note reviewed.  Constitutional:      General: He is not in acute distress.    Appearance: Normal appearance. He is not ill-appearing.  HENT:     Head: Normocephalic and atraumatic.     Right Ear: There is impacted cerumen.     Left Ear: There is impacted cerumen.     Nose: Congestion (mild) present.     Mouth/Throat:     Mouth: Mucous membranes are moist.     Pharynx: Oropharynx is clear. Posterior oropharyngeal erythema present. No oropharyngeal exudate.  Eyes:  Conjunctiva/sclera: Conjunctivae normal.  Cardiovascular:     Rate and Rhythm: Normal rate and regular rhythm.     Heart sounds: Normal heart sounds. No murmur heard. Pulmonary:     Effort: Pulmonary effort is normal. No respiratory distress.     Breath sounds: Normal breath sounds. No wheezing, rhonchi or rales.  Skin:    General: Skin is warm and dry.  Neurological:     Mental Status: He is alert.  Psychiatric:        Mood and Affect: Mood normal.        Thought Content: Thought content normal.     UC Treatments / Results  Labs (all labs ordered are listed, but only abnormal results are displayed) Labs Reviewed  CULTURE, GROUP A STREP ([facility])  COVID-19, FLU A+B NAA  POCT RAPID STREP A (OFFICE)    EKG   Radiology No results found.  Procedures Procedures (including critical care time)  Medications Ordered in UC Medications - No data to display  Initial Impression / Assessment and Plan / UC Course  I have reviewed the triage vital signs and the nursing notes.  Pertinent labs & imaging results that  were available during my care of the patient were reviewed by me and considered in my medical decision making (see chart for details).  Strep test negative in office.  Will order throat culture.  Suspect likely upper respiratory infection and viral etiology.  Recommended symptomatic treatment.  Encouraged follow-up if symptoms fail to improve with time or worsen anyway.  Final Clinical Impressions(s) / UC Diagnoses   Final diagnoses:  Acute upper respiratory infection   Discharge Instructions   None    ED Prescriptions   None    PDMP not reviewed this encounter.   Tomi Bamberger, PA-C 07/15/21 [phone]

## 2021-07-14 NOTE — ED Triage Notes (Signed)
Patient c/o sore throat x 2 weeks, non-productive cough x 1 day.  Patient has taken OTC Tylenol Cold & Flu.  Patient is not vaccinated for COVID.

## 2021-07-15 ENCOUNTER — Ambulatory Visit (INDEPENDENT_AMBULATORY_CARE_PROVIDER_SITE_OTHER): Payer: 59

## 2021-07-15 ENCOUNTER — Ambulatory Visit
Admission: EM | Admit: 2021-07-15 | Discharge: 2021-07-15 | Disposition: A | Discharge status: Home / Self Care | Payer: 59 | Attending: Physician Assistant | Admitting: Physician Assistant

## 2021-07-15 ENCOUNTER — Encounter: Payer: Self-pay | Admitting: Physician Assistant

## 2021-07-15 DIAGNOSIS — H6121 Impacted cerumen, right ear: Secondary | ICD-10-CM

## 2021-07-15 DIAGNOSIS — R059 Cough, unspecified: Secondary | ICD-10-CM

## 2021-07-15 DIAGNOSIS — J069 Acute upper respiratory infection, unspecified: Secondary | ICD-10-CM | POA: Diagnosis not present

## 2021-07-15 NOTE — Discharge Instructions (Signed)
Chest Xray normal. No signs of pneumonia. Recommend symptomatic treatment and follow up if symptoms do not improve or worsen.

## 2021-07-15 NOTE — ED Provider Notes (Signed)
[facility]    CSN: [id number] Arrival date & time: 07/15/21  1323      History   Chief Complaint Chief Complaint  Patient presents with   Otalgia    HPI Cody Rodgers is a 25 y.o. male.   Patient here today for evaluation of bilateral ear pain that started recently.  He states that his right ear feels stopped up.  He has tried using a Q-tip without significant relief.  He states that he also has a cough and is concerned that he might have pneumonia and requests x-ray.  His grandma was recently diagnosed with pneumonia.  He has not had any fever.  The history is provided by the patient.  Otalgia Associated symptoms: congestion, cough and sore throat   Associated symptoms: no diarrhea, no fever and no vomiting    History reviewed. No pertinent past medical history.  Patient Active Problem List   Diagnosis Date Noted   History of prediabetes 05/17/2021   Morbid obesity (HCC) 05/17/2021   Hamstring strain, right, initial encounter 02/01/2021    History reviewed. No pertinent surgical history.     Home Medications    Prior to Admission medications   Not on File    Family History History reviewed. No pertinent family history.  Social History Social History   Tobacco Use   Smoking status: Never   Smokeless tobacco: Never  Substance Use Topics   Alcohol use: Never   Drug use: Never     Allergies   Patient has no known allergies.   Review of Systems Review of Systems  Constitutional:  Negative for chills and fever.  HENT:  Positive for congestion, ear pain and sore throat.   Eyes:  Negative for discharge and redness.  Respiratory:  Positive for cough. Negative for shortness of breath.   Gastrointestinal:  Negative for diarrhea and vomiting.    Physical Exam Triage Vital Signs ED Triage Vitals  Enc Vitals Group     BP 07/15/21 1417 140/90     Pulse Rate 07/15/21 1417 78     Resp 07/15/21 1417 18     Temp 07/15/21 1417 98.6 F (37  C)     Temp Source 07/15/21 1417 Oral     SpO2 07/15/21 1417 94 %     Weight --      Height --      Head Circumference --      Peak Flow --      Pain Score 07/15/21 1418 6     Pain Loc --      Pain Edu? --      Excl. in GC? --    No data found.  Updated Vital Signs BP 140/90 (BP Location: Left Arm)   Pulse 78   Temp 98.6 F (37 C) (Oral)   Resp 18   SpO2 94%      Physical Exam Vitals and nursing note reviewed.  Constitutional:      General: He is not in acute distress.    Appearance: Normal appearance. He is not ill-appearing.  HENT:     Head: Normocephalic and atraumatic.     Right Ear: Tympanic membrane normal. There is impacted cerumen.     Left Ear: Tympanic membrane normal.     Nose: Congestion (mild) present.  Eyes:     Conjunctiva/sclera: Conjunctivae normal.  Cardiovascular:     Rate and Rhythm: Normal rate.     Pulses: Normal pulses.  Neurological:     Mental  Status: He is alert.  Psychiatric:        Mood and Affect: Mood normal.        Behavior: Behavior normal.     UC Treatments / Results  Labs (all labs ordered are listed, but only abnormal results are displayed) Labs Reviewed - No data to display  EKG   Radiology DG Chest 2 View  Result Date: 07/15/2021 CLINICAL DATA:  Cough for 2 weeks EXAM: CHEST - 2 VIEW COMPARISON:  10/14/2020 FINDINGS: The heart size and mediastinal contours are within normal limits. Both lungs are clear. The visualized skeletal structures are unremarkable. IMPRESSION: No active cardiopulmonary disease. Electronically Signed   By: Elige Ko M.D.   On: 07/15/2021 15:20    Procedures Procedures (including critical care time)  Medications Ordered in UC Medications - No data to display  Initial Impression / Assessment and Plan / UC Course  I have reviewed the triage vital signs and the nursing notes.  Pertinent labs & imaging results that were available during my care of the patient were reviewed by me and  considered in my medical decision making (see chart for details).   Discussed low suspicion for pneumonia but patient would like xray. Reassured that Xray was normal. Suspect likely viral etiology of symptoms. Recommend symptomatic treatment. Irrigation of right ear resolved cerumen impaction.   Final Clinical Impressions(s) / UC Diagnoses   Final diagnoses:  Impacted cerumen of right ear  Acute upper respiratory infection     Discharge Instructions      Chest Xray normal. No signs of pneumonia. Recommend symptomatic treatment and follow up if symptoms do not improve or worsen.      ED Prescriptions   None    PDMP not reviewed this encounter.   Tomi Bamberger, PA-C 07/15/21 1536

## 2021-07-15 NOTE — ED Triage Notes (Signed)
Pt present bilateral pain in ears, pt states his right ear full stopped up and needs flushed. Symptoms started on Tuesday.

## 2021-07-16 LAB — COVID-19, FLU A+B NAA
Influenza A, NAA: NOT DETECTED
Influenza B, NAA: NOT DETECTED
SARS-CoV-2, NAA: NOT DETECTED

## 2021-07-17 LAB — CULTURE, GROUP A STREP (THRC)

## 2022-12-31 ENCOUNTER — Encounter: Payer: Self-pay | Admitting: *Deleted

## 2023-01-04 IMAGING — DX DG CHEST 2V
3 series · 3 of 3 positions shown · non-contrast
Comparison: 10/14/2020

CLINICAL DATA: Cough for 2 weeks

EXAM:
CHEST - 2 VIEW

[chest pa]
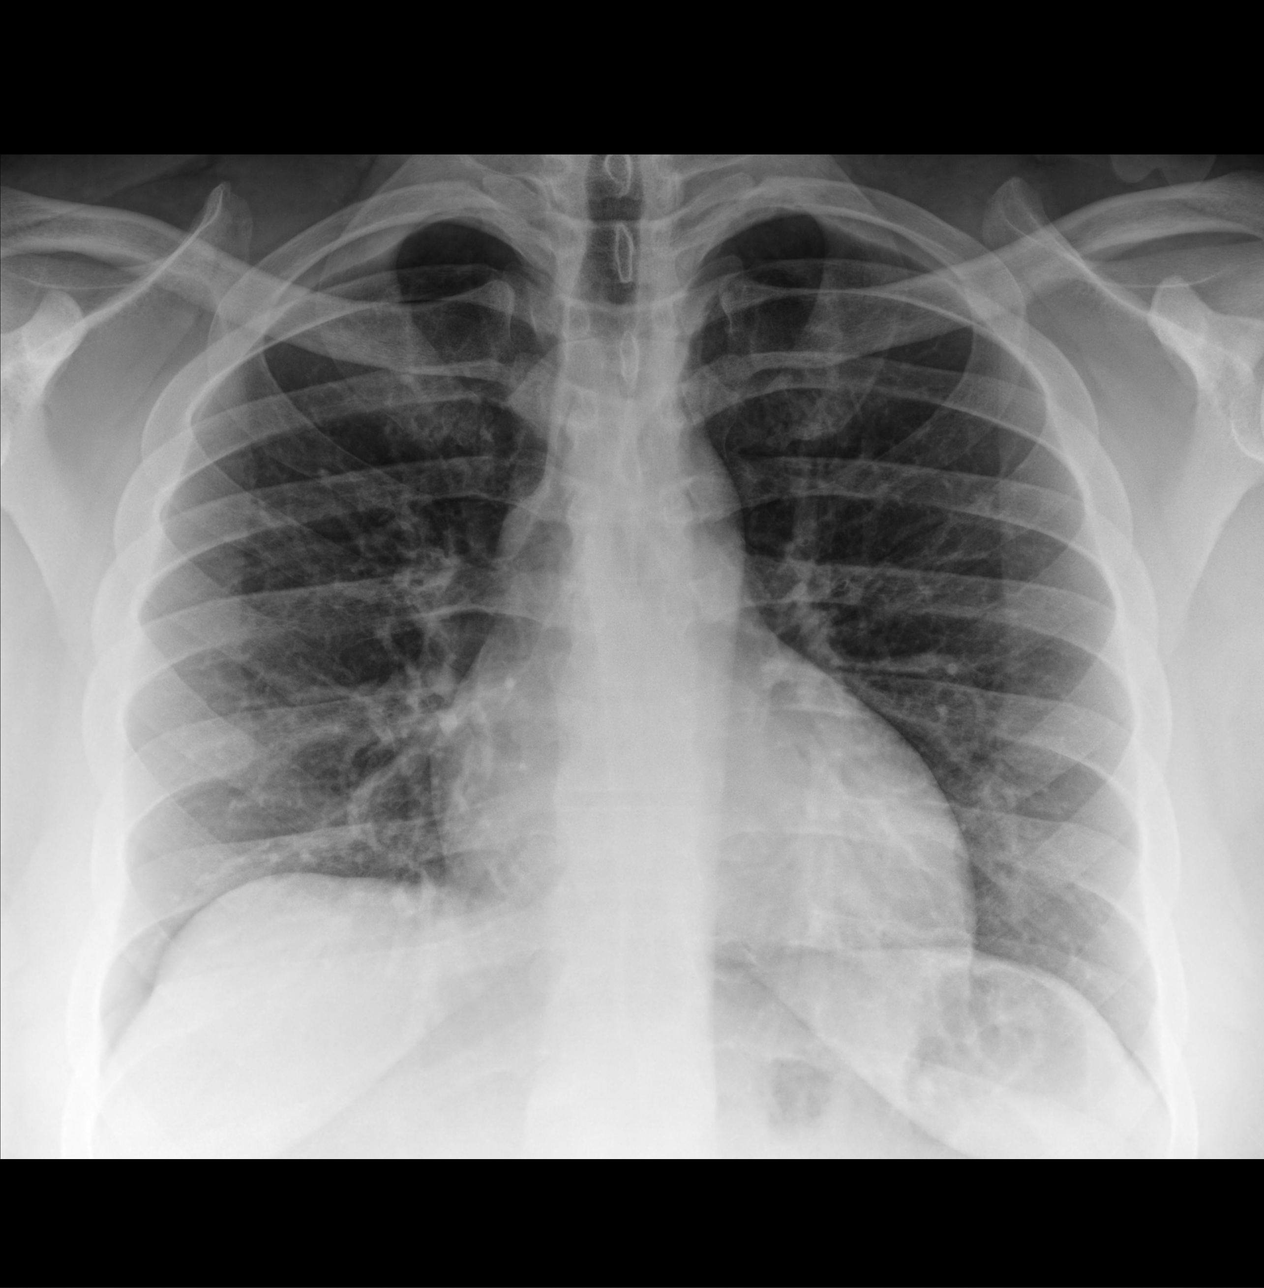

[chest lat (1 of 2)]
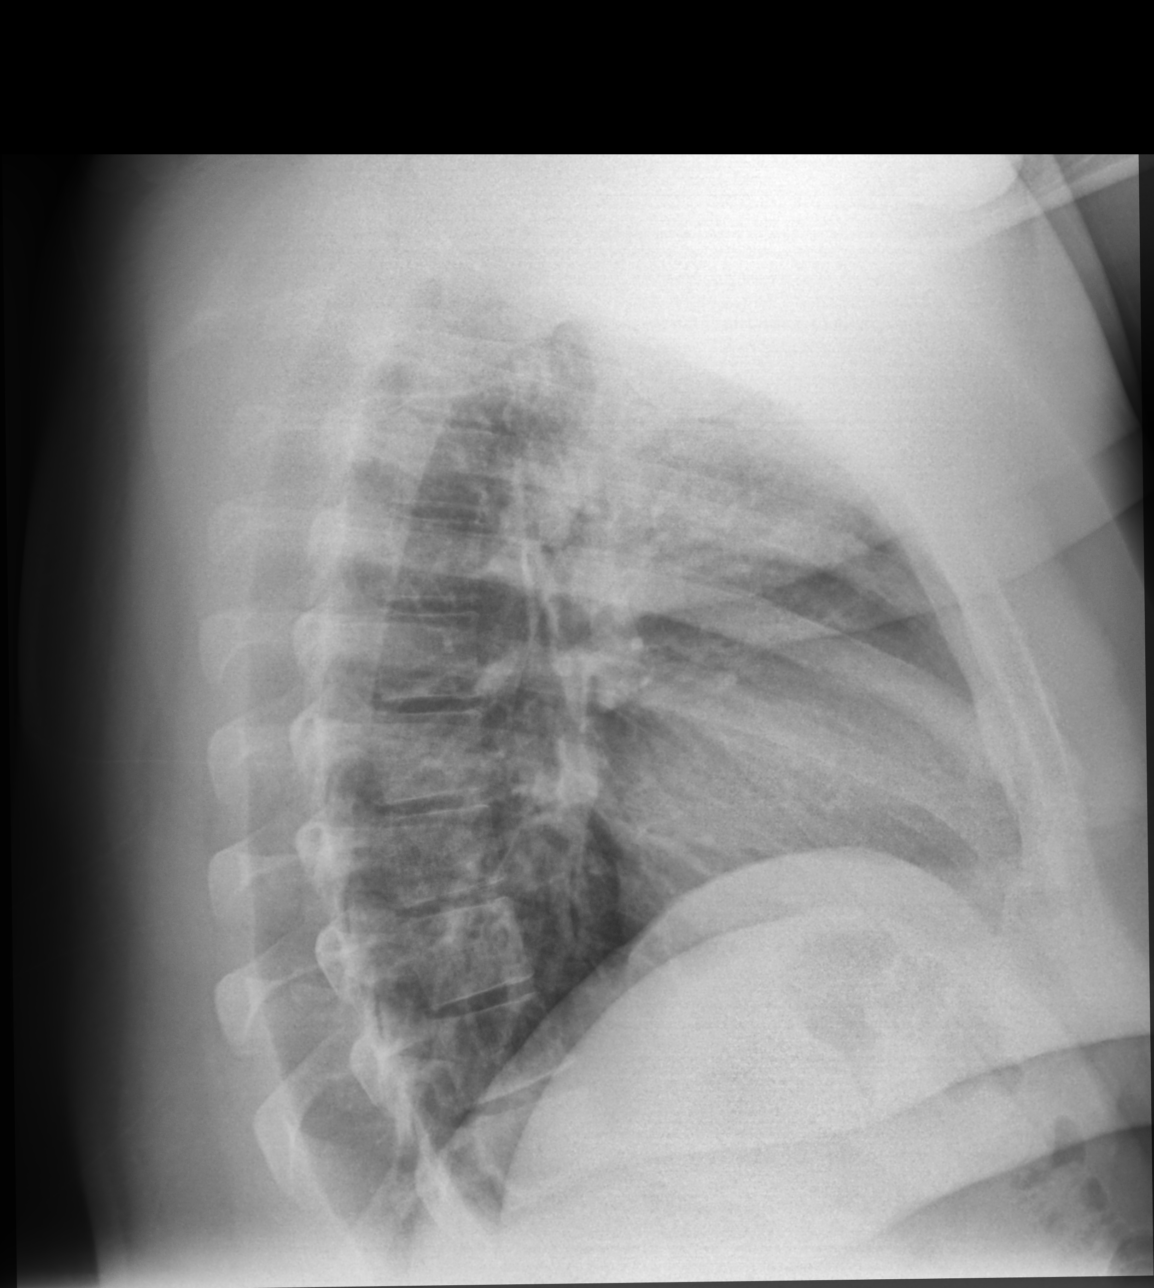

[chest lat (2 of 2)]
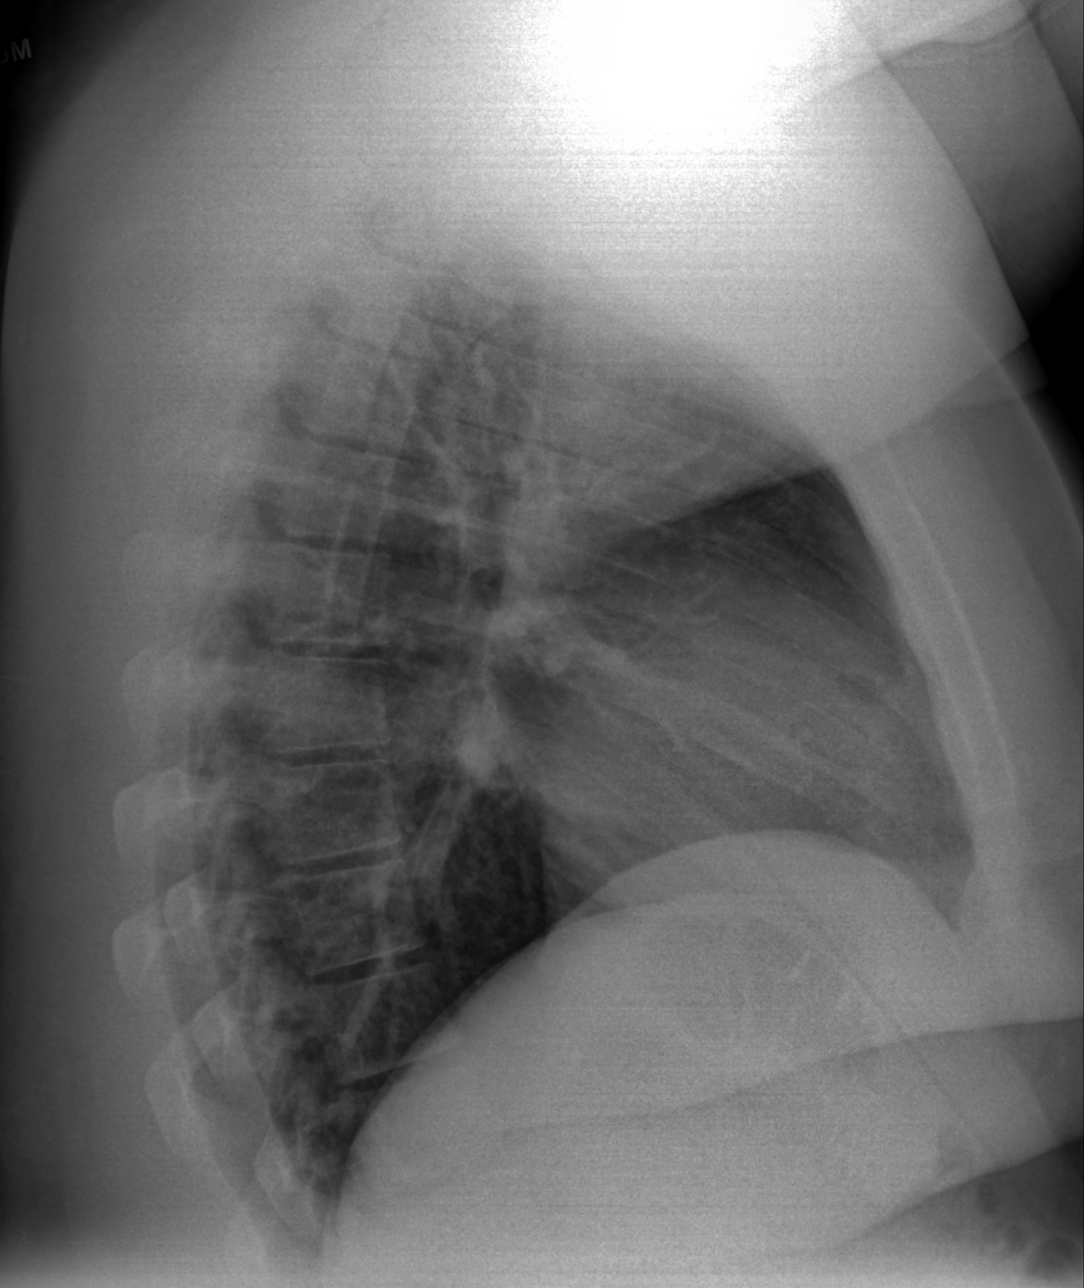

[3 of 3 positions shown; findings below may reference images not displayed]

FINDINGS: The heart size and mediastinal contours are within normal limits.
Both lungs are clear. The visualized skeletal structures are
unremarkable.
IMPRESSION: No active cardiopulmonary disease.

## 2024-07-22 ENCOUNTER — Telehealth: Payer: Self-pay

## 2024-07-22 NOTE — Telephone Encounter (Signed)
 Copied from CRM [id number]. Topic: Appointments - Transfer of Care >> Jul 22, 2024  4:06 PM Alfonso ORN wrote: Pt is requesting to transfer FROM: [facility] Emil Schanz MD Pt is requesting to transfer TO: Merna Huxley  Reason for requested transfer: just would like a new pcp It is the responsibility of the team the patient would like to transfer to (Dr. Merna, Huxley) to reach out to the patient if for any reason this transfer is not acceptable.

## 2024-07-22 NOTE — Telephone Encounter (Signed)
Are you ok with this transfer

## 2024-07-22 NOTE — Telephone Encounter (Signed)
 I am okay with this transfer.  I have not seen him in over 3 years.

## 2024-09-08 ENCOUNTER — Ambulatory Visit: Admitting: Adult Health

## 2024-09-08 NOTE — Progress Notes (Deleted)
" ° ° °  Patient presents to clinic today to establish care. He is a 28 year old male who  has no past medical history on file.    Acute Concerns:   Chronic Issues: Prediabetes - he has not been on any medications in the past  Lab Results  Component Value Date   HGBA1C 4.9 05/17/2021   Morbid Obesity  Wt Readings from Last 3 Encounters:  07/14/21 (!) 370 lb (167.8 kg)  05/17/21 (!) 368 lb (166.9 kg)  02/01/21 (!) 365 lb (165.6 kg)   Health Maintenance: Dental -- Vision -- Immunizations -- Colonoscopy --    No past medical history on file.  No past surgical history on file.  Medications Ordered Prior to Encounter[1]  Allergies[2]  No family history on file.  Social History   Socioeconomic History   Marital status: Single    Spouse name: Not on file   Number of children: Not on file   Years of education: Not on file   Highest education level: Not on file  Occupational History   Not on file  Tobacco Use   Smoking status: Never   Smokeless tobacco: Never  Substance and Sexual Activity   Alcohol use: Never   Drug use: Never   Sexual activity: Not on file  Other Topics Concern   Not on file  Social History Narrative   Not on file   Social Drivers of Health   Tobacco Use: Not on file  Financial Resource Strain: Not on file  Food Insecurity: Not on file  Transportation Needs: Not on file  Physical Activity: Not on file  Stress: Not on file  Social Connections: Unknown (01/30/2022)   Received from [facility]   Social Network    Social Network: Not on file  Intimate Partner Violence: Unknown (12/20/2021)   Received from [facility]   HITS    Physically Hurt: Not on file    Insult or Talk Down To: Not on file    Threaten Physical Harm: Not on file    Scream or Curse: Not on file  Depression (EYV7-0): Not on file  Alcohol Screen: Not on file  Housing: Not on file  Utilities: Not on file  Health Literacy: Not on file    ROS  There were no  vitals taken for this visit.  Physical Exam  No results found for this or any previous visit (from the past 2160 hours).  Assessment/Plan: No problem-specific Assessment & Plan notes found for this encounter.         [1]  No current outpatient medications on file prior to visit.   No current facility-administered medications on file prior to visit.  [2] No Known Allergies  "

## 2024-09-22 ENCOUNTER — Ambulatory Visit: Admitting: Adult Health
